# Patient Record
Sex: Female | Born: 1995 | Race: White | Hispanic: No | Marital: Married | Smoking: Never smoker
Health system: Southern US, Community
[De-identification: ages and names within clinical notes are randomized; demographics above are authoritative.]

## PROBLEM LIST (undated history)

## (undated) ENCOUNTER — Ambulatory Visit (HOSPITAL_COMMUNITY): Admission: EM | Payer: Self-pay | Source: Home / Self Care

## (undated) DIAGNOSIS — R51 Headache: Secondary | ICD-10-CM

## (undated) DIAGNOSIS — N83209 Unspecified ovarian cyst, unspecified side: Secondary | ICD-10-CM

## (undated) HISTORY — DX: Headache: R51

---
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
## 2016-10-16 ENCOUNTER — Ambulatory Visit (HOSPITAL_COMMUNITY)
Admission: EM | Admit: 2016-10-16 | Discharge: 2016-10-16 | Disposition: A | Discharge status: Home / Self Care | Payer: Self-pay | Attending: Family Medicine | Admitting: Family Medicine

## 2016-10-16 ENCOUNTER — Encounter (HOSPITAL_COMMUNITY): Payer: Self-pay | Admitting: Family Medicine

## 2016-10-16 DIAGNOSIS — R102 Pelvic and perineal pain: Secondary | ICD-10-CM

## 2016-10-16 DIAGNOSIS — R1030 Lower abdominal pain, unspecified: Secondary | ICD-10-CM

## 2016-10-16 DIAGNOSIS — Z3202 Encounter for pregnancy test, result negative: Secondary | ICD-10-CM

## 2016-10-16 LAB — POCT URINALYSIS DIP (DEVICE)
BILIRUBIN URINE: NEGATIVE
Glucose, UA: NEGATIVE mg/dL
Hgb urine dipstick: NEGATIVE
KETONES UR: NEGATIVE mg/dL
LEUKOCYTES UA: NEGATIVE
NITRITE: NEGATIVE
Protein, ur: NEGATIVE mg/dL
Specific Gravity, Urine: 1.02 (ref 1.005–1.030)
Urobilinogen, UA: 1 mg/dL (ref 0.0–1.0)
pH: 8.5 — ABNORMAL HIGH (ref 5.0–8.0)

## 2016-10-16 LAB — POCT PREGNANCY, URINE: PREG TEST UR: NEGATIVE

## 2016-10-16 MED ORDER — NAPROXEN 500 MG PO TABS: 500.0000 mg | ORAL_TABLET | Freq: Two times a day (BID) | ORAL | 0 refills | Status: AC

## 2016-10-16 NOTE — ED Triage Notes (Signed)
Pt here forintermittent pelvic pain onset this am while she was working out doing squats associated w/nausea and dizziness  Pt denies urinary sx, vag d/c  Sexually active w/partner of 2 years.... Uses condoms  Sts occasional pelvic pain during sex  A&O x4... NAD... Ambulatory

## 2016-10-16 NOTE — ED Provider Notes (Signed)
  [facility]   [id number] 10/16/16 Arrival Time: 1500   SUBJECTIVE:  Rachel Lowery is a 21 y.o. female who presents to the urgent care with complaint of abdominal pain.  She was doing deep squats for exercisetoday when she felt a sharp suprapubic pain. It is lessened over the last few hours.  Patient has had intermittent lower abdominal pain over the past yearand has had dypareunia.  LMP 4 weeks ago.     Past Medical History:  Diagnosis Date  . Headache(784.0)    Family History  Problem Relation Age of Onset  . Migraines Sister        1 older sister has migraines  . Migraines Maternal Grandmother   . Depression Maternal Grandmother   . Migraines Other        Strong fhx of migraines in maternal great aunts and maternal great uncles  . Depression Other        Maternal Great Grandmother   Social History   Social History  . Marital status: Single    Spouse name: N/A  . Number of children: N/A  . Years of education: N/A   Occupational History  . Not on file.   Social History Main Topics  . Smoking status: Never Smoker  . Smokeless tobacco: Never Used  . Alcohol use No  . Drug use: No  . Sexual activity: No   Other Topics Concern  . Not on file   Social History Narrative  . No narrative on file   No outpatient prescriptions have been marked as taking for the 10/16/16 encounter [facility] Encounter).   No Known Allergies    ROS: As per HPI, remainder of ROS negative.   OBJECTIVE:   Vitals:   10/16/16 1539  BP: 117/61  Pulse: 91  Resp: 18  Temp: 99.2 F (37.3 C)  TempSrc: Oral  SpO2: 96%     General appearance: alert; no distress Eyes: PERRL; EOMI; conjunctiva normal HENT: normocephalic; atraumatic; TMs normal, canal normal, external ears normal without trauma; nasal mucosa normal; oral mucosa normal Neck: supple Lungs: clear to auscultation bilaterally Heart: regular rate and rhythm Abdomen: soft, mild diffuse tenderness; bowel  sounds normal; no masses or organomegaly; no guarding or rebound tenderness Pelvic:  NEFG, normal nulliparous cervix, scant vaginal d/c Back: no CVA tenderness Extremities: no cyanosis or edema; symmetrical with no gross deformities Skin: warm and dry Neurologic: normal gait; grossly normal Psychological: alert and cooperative; normal mood and affect      Labs:  No results found for this or any previous visit.  Labs Reviewed  POCT URINALYSIS DIP (DEVICE)    No results found.     ASSESSMENT & PLAN:  1. Pelvic pain in female     Meds ordered this encounter  Medications  . naproxen (NAPROSYN) 500 MG tablet    Sig: Take 1 tablet (500 mg total) by mouth 2 (two) times daily.    Dispense:  30 tablet    Refill:  0    Reviewed expectations re: course of current medical issues. Questions answered. Outlined signs and symptoms indicating need for more acute intervention. Patient verbalized understanding. After Visit Summary given.    Procedures:      Elvina Sidle, MD 10/16/16 1630

## 2016-10-16 NOTE — ED Notes (Signed)
Provided water.  Patient unable to urinate currently

## 2016-10-16 NOTE — Discharge Instructions (Signed)
I believe you have strained your lower abdominal wall muscles causing the acute symptoms.  Your chronic symptoms, however, need further evaluation.  Call the gynecologist below to get that checked.  The urine test is completely negative

## 2017-01-12 ENCOUNTER — Other Ambulatory Visit: Payer: Self-pay | Admitting: Family Medicine

## 2017-01-12 DIAGNOSIS — E010 Iodine-deficiency related diffuse (endemic) goiter: Secondary | ICD-10-CM

## 2017-05-14 ENCOUNTER — Other Ambulatory Visit: Payer: Self-pay

## 2017-11-28 ENCOUNTER — Ambulatory Visit (HOSPITAL_COMMUNITY)
Admission: EM | Admit: 2017-11-28 | Discharge: 2017-11-28 | Disposition: A | Discharge status: Home / Self Care | Payer: Self-pay | Attending: Family Medicine | Admitting: Family Medicine

## 2017-11-28 ENCOUNTER — Encounter (HOSPITAL_COMMUNITY): Payer: Self-pay | Admitting: Emergency Medicine

## 2017-11-28 ENCOUNTER — Other Ambulatory Visit: Payer: Self-pay

## 2017-11-28 DIAGNOSIS — Z820 Family history of epilepsy and other diseases of the nervous system: Secondary | ICD-10-CM | POA: Insufficient documentation

## 2017-11-28 DIAGNOSIS — J029 Acute pharyngitis, unspecified: Secondary | ICD-10-CM

## 2017-11-28 DIAGNOSIS — R221 Localized swelling, mass and lump, neck: Secondary | ICD-10-CM

## 2017-11-28 HISTORY — DX: Unspecified ovarian cyst, unspecified side: N83.209

## 2017-11-28 LAB — POCT INFECTIOUS MONO SCREEN: MONO SCREEN: NEGATIVE

## 2017-11-28 LAB — POCT RAPID STREP A: STREPTOCOCCUS, GROUP A SCREEN (DIRECT): NEGATIVE

## 2017-11-28 MED ORDER — AMOXICILLIN 875 MG PO TABS: 875.0000 mg | ORAL_TABLET | Freq: Two times a day (BID) | ORAL | 0 refills | Status: AC

## 2017-11-28 NOTE — ED Triage Notes (Signed)
Right side of throat is swollen, right jaw pain .  Onset of symptoms for 2 weeks, went away briefly, then returned one week ago.  Since the return, pain has been more severe and constant

## 2017-11-28 NOTE — Discharge Instructions (Addendum)
You may use over the counter ibuprofen or acetaminophen as needed.  For a sore throat, over the counter products such as [employer] Peroxyl Mouth Sore Rinse or Chloraseptic Sore Throat Spray may provide some temporary relief. Your rapid strep test and mono test were both negative today. We have sent your throat swab for culture and will let you know of any positive results. But given how your throat looks, I am placing you on an antibiotic that would treat a tonsil infection.

## 2017-12-01 LAB — CULTURE, GROUP A STREP (THRC)

## 2017-12-03 NOTE — ED Provider Notes (Signed)
[facility]   [id number] 11/28/17 Arrival Time: 1431  ASSESSMENT & PLAN:  1. Sore throat   No sign of a peritonsillar abscess.  Meds ordered this encounter  Medications  . amoxicillin (AMOXIL) 875 MG tablet    Sig: Take 1 tablet (875 mg total) by mouth 2 (two) times daily for 10 days.    Dispense:  20 tablet    Refill:  0   Strep negative. Culture sent. Will treat based on appearance of throat. Negative monospot. Discussed possibility of this being viral.  Labs Reviewed  CULTURE, GROUP A STREP [facility])  POCT RAPID STREP A  POCT INFECTIOUS MONO SCREEN   OTC analgesics and throat care as needed  Instructed to finish full 10 day course of antibiotics. Will follow up if not showing significant improvement over the next 24-48 hours.    Discharge Instructions      You may use over the counter ibuprofen or acetaminophen as needed.   For a sore throat, over the counter products such as [employer] Peroxyl Mouth Sore Rinse or Chloraseptic Sore Throat Spray may provide some temporary relief.  Your rapid strep test and mono test were both negative today. We have sent your throat swab for culture and will let you know of any positive results. But given how your throat looks, I am placing you on an antibiotic that would treat a tonsil infection.   Reviewed expectations re: course of current medical issues. Questions answered. Outlined signs and symptoms indicating need for more acute intervention. Patient verbalized understanding. After Visit Summary given.   SUBJECTIVE:  Rachel Lowery is a 22 y.o. female who reports a sore throat. Describes as pain with swallowing. More R sided. Mild R jaw discomfort associated with ST. Onset abrupt beginning 1-2 days ago. Reports similar symptoms 1-2 weeks ago but resolved and not as severe. No respiratory symptoms. Mild fatigue. Normal PO intake but reports significant pain with swallowing. Fever reported: questions subjective. No  associated n/v/abdominal symptoms. Sick contacts: none known. No rashes.  OTC treatment: OTC analgesics without much relief.  ROS: As per HPI. All other systems negative.    OBJECTIVE:  Vitals:   11/28/17 1538  BP: 109/65  Pulse: 91  Resp: 18  Temp: 98.4 F (36.9 C)  TempSrc: Oral  SpO2: 100%     General appearance: alert; no distress HEENT: throat with tonsillar hypertrophy (R>L) and marked erythema; uvula midline yes Neck: supple with FROM; small cervical LAD on R; non-tender CV: RRR Lungs: clear to auscultation bilaterally Abd: soft; non-tender Skin: reveals no rash; warm and dry LE: no edema Psychological: alert and cooperative; normal mood and affect  No Known Allergies  Past Medical History:  Diagnosis Date  . Headache(784.0)   . Ovarian cyst    Social History   Socioeconomic History  . Marital status: Single    Spouse name: Not on file  . Number of children: Not on file  . Years of education: Not on file  . Highest education level: Not on file  Occupational History  . Not on file  Social Needs  . Financial resource strain: Not on file  . Food insecurity:    Worry: Not on file    Inability: Not on file  . Transportation needs:    Medical: Not on file    Non-medical: Not on file  Tobacco Use  . Smoking status: Never Smoker  . Smokeless tobacco: Never Used  Substance and Sexual Activity  . Alcohol use: No  .  Drug use: No  . Sexual activity: Never    Birth control/protection: None  Lifestyle  . Physical activity:    Days per week: Not on file    Minutes per session: Not on file  . Stress: Not on file  Relationships  . Social connections:    Talks on phone: Not on file    Gets together: Not on file    Attends religious service: Not on file    Active member of club or organization: Not on file    Attends meetings of clubs or organizations: Not on file    Relationship status: Not on file  . Intimate partner violence:    Fear of current or  ex partner: Not on file    Emotionally abused: Not on file    Physically abused: Not on file    Forced sexual activity: Not on file  Other Topics Concern  . Not on file  Social History Narrative  . Not on file   Family History  Problem Relation Age of Onset  . Migraines Sister        1 older sister has migraines  . Migraines Maternal Grandmother   . Depression Maternal Grandmother   . Migraines Other        Strong fhx of migraines in maternal great aunts and maternal great uncles  . Depression Other        Maternal Albertina ParrGreat Grandmother          Larance Ratledge, MD 12/03/17 1004

## 2024-03-09 ENCOUNTER — Inpatient Hospital Stay (HOSPITAL_COMMUNITY): Admission: RE | Admit: 2024-03-09 | Source: Home / Self Care | Admitting: Obstetrics
